# Patient Record
Sex: Male | Born: 1995 | Race: White | Hispanic: No | Marital: Single | Smoking: Never smoker
Health system: Southern US, Community
[De-identification: ages and names within clinical notes are randomized; demographics above are authoritative.]

## PROBLEM LIST (undated history)

## (undated) DIAGNOSIS — F840 Autistic disorder: Secondary | ICD-10-CM

## (undated) DIAGNOSIS — R4189 Other symptoms and signs involving cognitive functions and awareness: Secondary | ICD-10-CM

## (undated) DIAGNOSIS — Q043 Other reduction deformities of brain: Secondary | ICD-10-CM

## (undated) DIAGNOSIS — F82 Specific developmental disorder of motor function: Secondary | ICD-10-CM

## (undated) DIAGNOSIS — F419 Anxiety disorder, unspecified: Secondary | ICD-10-CM

## (undated) DIAGNOSIS — F70 Mild intellectual disabilities: Secondary | ICD-10-CM

## (undated) DIAGNOSIS — H519 Unspecified disorder of binocular movement: Secondary | ICD-10-CM

## (undated) DIAGNOSIS — Q8789 Other specified congenital malformation syndromes, not elsewhere classified: Secondary | ICD-10-CM

## (undated) DIAGNOSIS — T7840XA Allergy, unspecified, initial encounter: Secondary | ICD-10-CM

## (undated) DIAGNOSIS — J45909 Unspecified asthma, uncomplicated: Secondary | ICD-10-CM

## (undated) DIAGNOSIS — F064 Anxiety disorder due to known physiological condition: Secondary | ICD-10-CM

## (undated) HISTORY — DX: Unspecified disorder of binocular movement: H51.9

## (undated) HISTORY — DX: Anxiety disorder, unspecified: F41.9

## (undated) HISTORY — DX: Mild intellectual disabilities: F70

## (undated) HISTORY — DX: Allergy, unspecified, initial encounter: T78.40XA

## (undated) HISTORY — DX: Unspecified asthma, uncomplicated: J45.909

## (undated) HISTORY — PX: OTHER SURGICAL HISTORY: SHX169

## (undated) HISTORY — PX: MRI: SHX5353

---
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
## 2017-01-24 DIAGNOSIS — Z0000 Encounter for general adult medical examination without abnormal findings: Secondary | ICD-10-CM | POA: Diagnosis not present

## 2017-01-24 DIAGNOSIS — Z23 Encounter for immunization: Secondary | ICD-10-CM | POA: Diagnosis not present

## 2017-01-24 DIAGNOSIS — Z13220 Encounter for screening for lipoid disorders: Secondary | ICD-10-CM | POA: Diagnosis not present

## 2017-01-24 DIAGNOSIS — Q8789 Other specified congenital malformation syndromes, not elsewhere classified: Secondary | ICD-10-CM | POA: Diagnosis not present

## 2017-02-05 ENCOUNTER — Other Ambulatory Visit: Payer: Self-pay | Admitting: Family Medicine

## 2017-02-05 DIAGNOSIS — Q8789 Other specified congenital malformation syndromes, not elsewhere classified: Secondary | ICD-10-CM

## 2017-02-12 ENCOUNTER — Ambulatory Visit
Admission: RE | Admit: 2017-02-12 | Discharge: 2017-02-12 | Disposition: A | Discharge status: Home / Self Care | Payer: BLUE CROSS/BLUE SHIELD | Source: Ambulatory Visit | Attending: Family Medicine | Admitting: Family Medicine

## 2017-02-12 DIAGNOSIS — Q8789 Other specified congenital malformation syndromes, not elsewhere classified: Secondary | ICD-10-CM

## 2017-02-12 DIAGNOSIS — Q043 Other reduction deformities of brain: Secondary | ICD-10-CM | POA: Diagnosis not present

## 2017-02-19 DIAGNOSIS — F840 Autistic disorder: Secondary | ICD-10-CM | POA: Diagnosis not present

## 2017-02-22 DIAGNOSIS — H5007 Alternating esotropia with V pattern: Secondary | ICD-10-CM | POA: Diagnosis not present

## 2017-05-29 DIAGNOSIS — F840 Autistic disorder: Secondary | ICD-10-CM | POA: Diagnosis not present

## 2018-06-27 IMAGING — US US ABDOMEN COMPLETE
1 series · 14 of 25 positions shown · non-contrast
Comparison: 12/12/2011

CLINICAL DATA: Follow-up Real syndrome

EXAM:
ABDOMEN ULTRASOUND COMPLETE

[Series 1: us abdomen complete · 0.15mm/px · 14 of 70 slices shown]
[im 1/70]
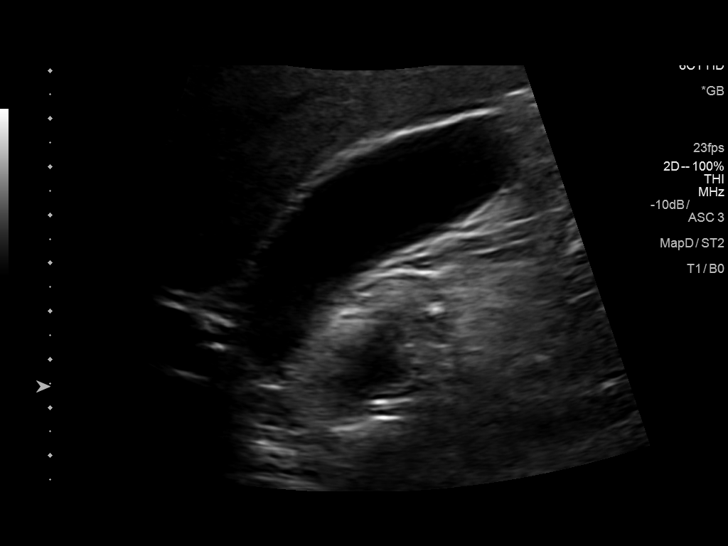
[im 6/70]
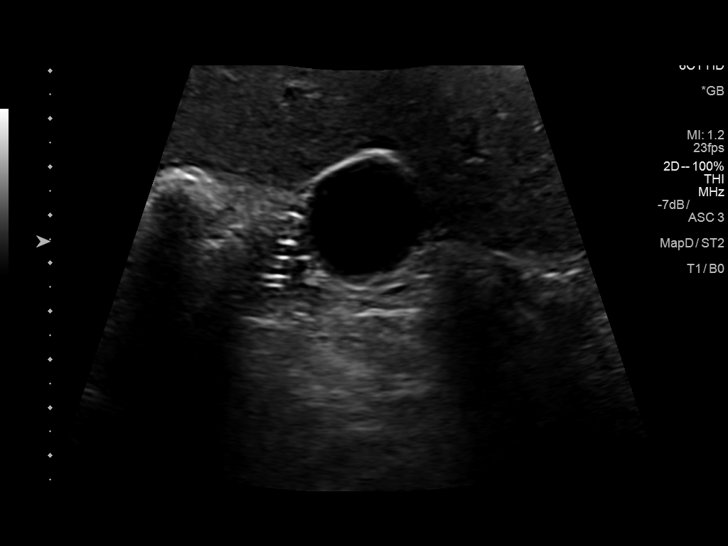
[im 12/70]
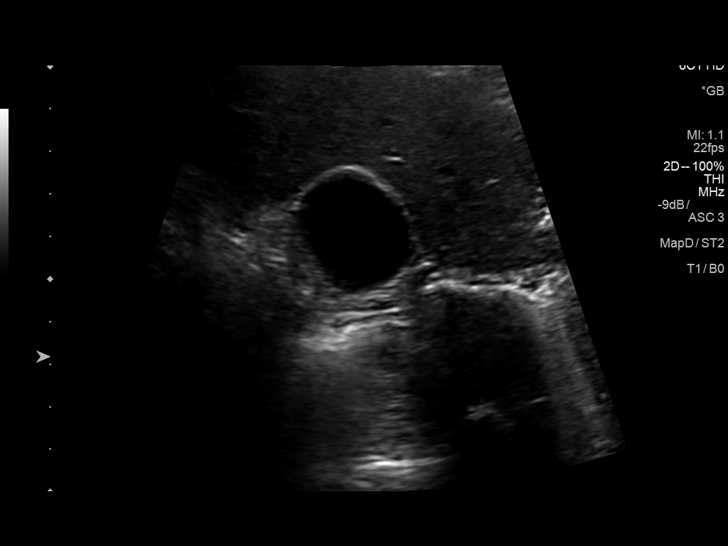
[im 18/70]
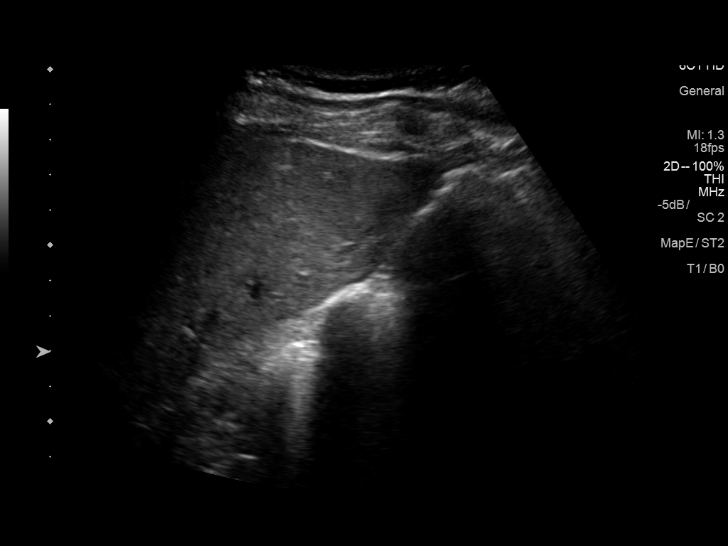
[im 24/70]
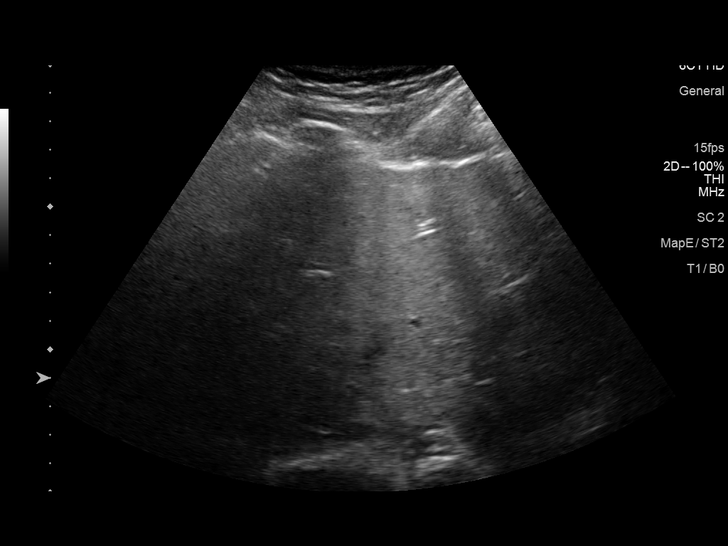
[im 26/70]
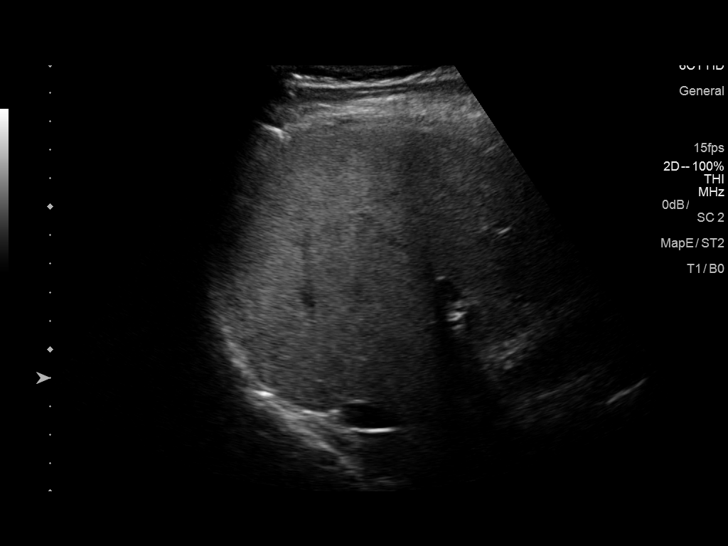
[im 32/70]
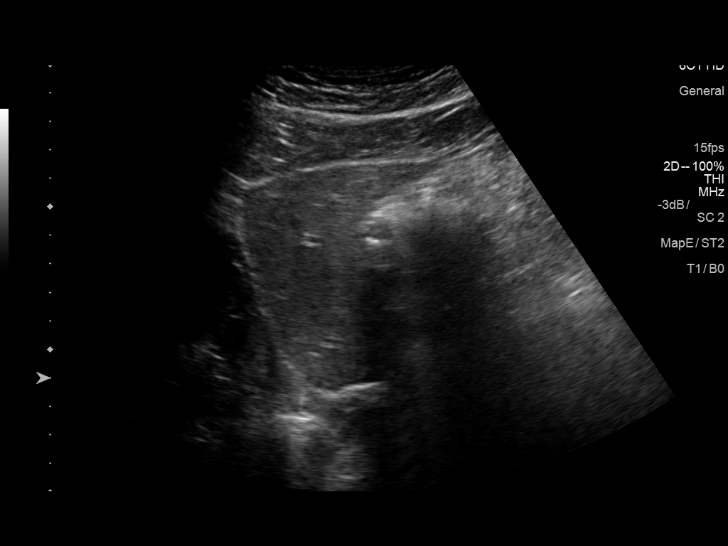
[im 38/70]
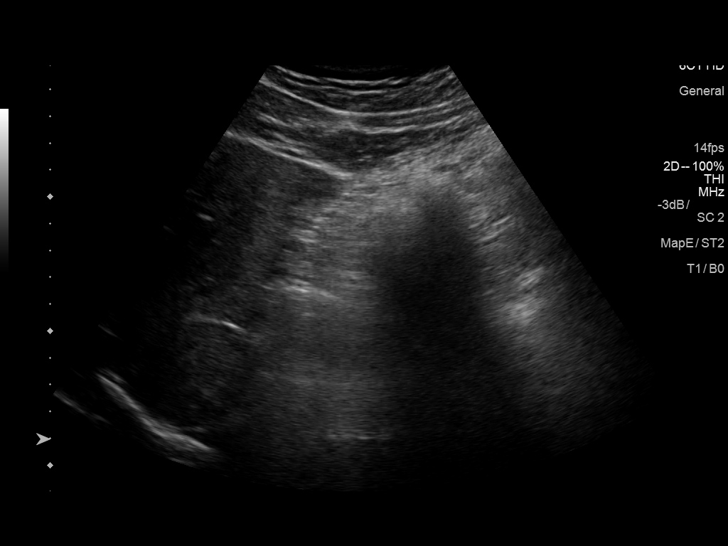
[im 44/70]
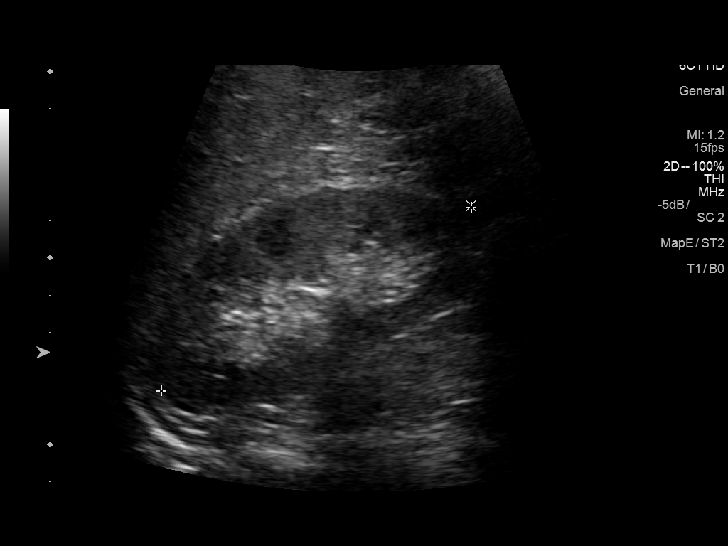
[im 47/70]
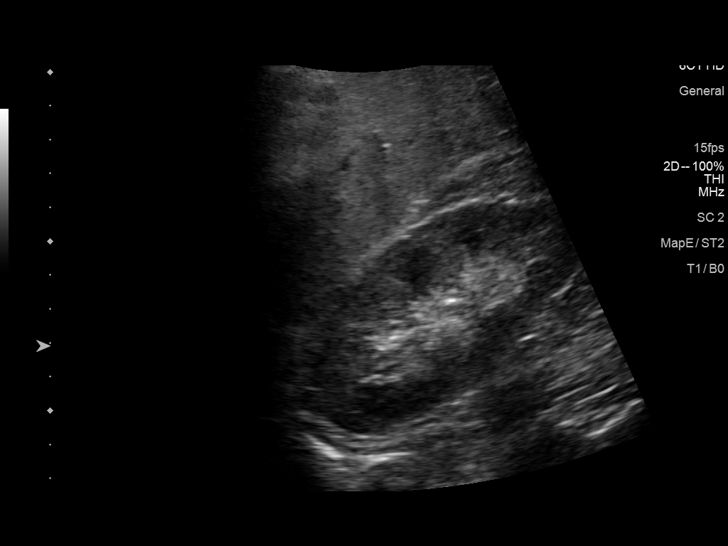
[im 52/70]
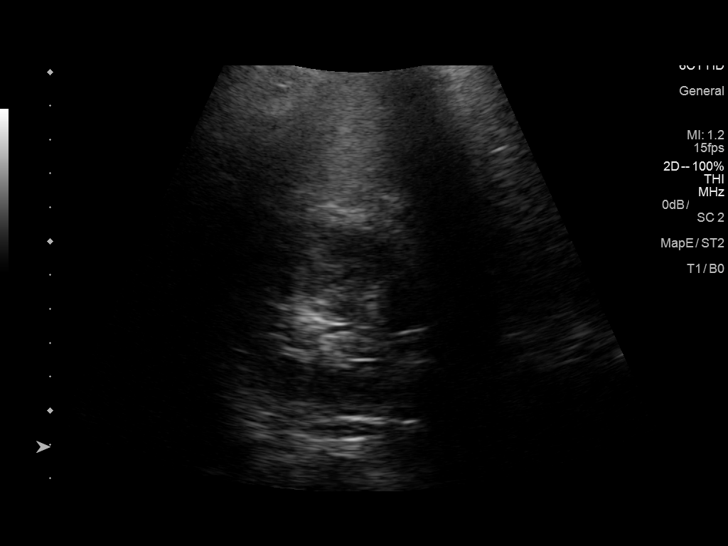
[im 58/70]
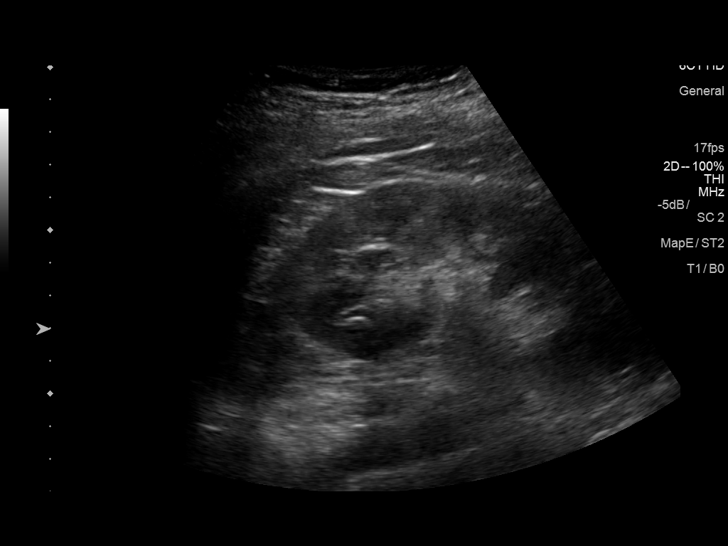
[im 64/70]
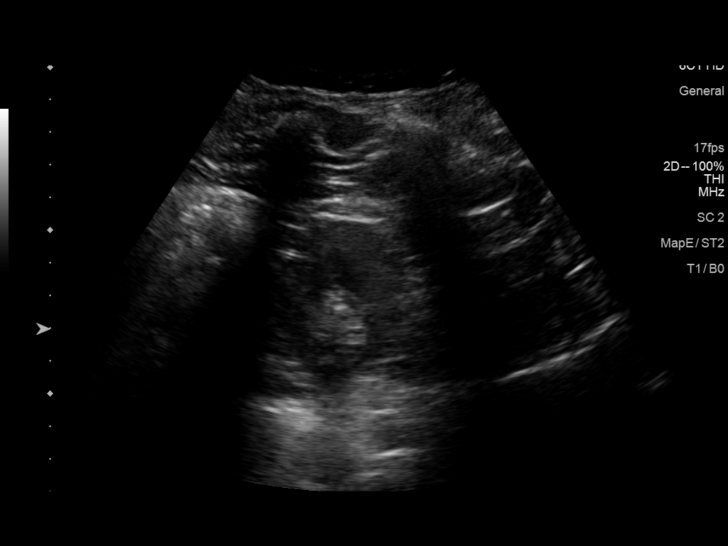
[im 70/70]
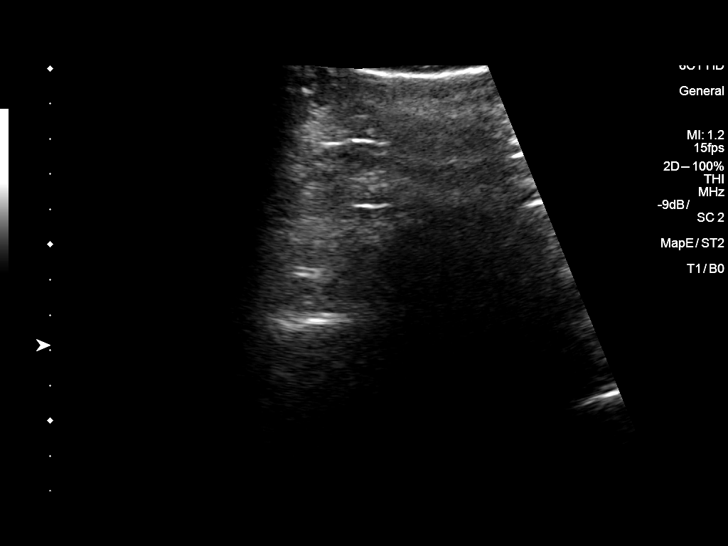

[14 of 25 positions shown; findings below may reference images not displayed]

FINDINGS: Gallbladder: No gallstones or wall thickening visualized. No
sonographic Murphy sign noted by sonographer.

Common bile duct: Diameter: Normal caliber, 2 mm

Liver: No focal lesion identified. Within normal limits in
parenchymal echogenicity. Portal vein is patent on color Doppler
imaging with normal direction of blood flow towards the liver.

IVC: No abnormality visualized.

Pancreas: Not well visualized due to overlying bowel gas

Spleen: Size and appearance within normal limits.

Right Kidney: Length: 10.3 cm. Echogenicity within normal limits. No
mass or hydronephrosis visualized.

Left Kidney: Length: 10.9 cm. Echogenicity within normal limits. No
mass or hydronephrosis visualized.

Abdominal aorta: No aneurysm visualized.

Other findings: None.
IMPRESSION: Unremarkable abdominal ultrasound. The pancreas is not well
visualized due to overlying bowel gas.

## 2018-12-18 DIAGNOSIS — Q031 Atresia of foramina of Magendie and Luschka: Secondary | ICD-10-CM | POA: Diagnosis not present

## 2018-12-18 DIAGNOSIS — H5006 Alternating esotropia with A pattern: Secondary | ICD-10-CM | POA: Diagnosis not present

## 2018-12-18 DIAGNOSIS — H5203 Hypermetropia, bilateral: Secondary | ICD-10-CM | POA: Diagnosis not present

## 2019-03-11 DIAGNOSIS — Z23 Encounter for immunization: Secondary | ICD-10-CM | POA: Diagnosis not present

## 2019-12-02 DIAGNOSIS — Z0000 Encounter for general adult medical examination without abnormal findings: Secondary | ICD-10-CM | POA: Diagnosis not present

## 2019-12-02 DIAGNOSIS — Q8789 Other specified congenital malformation syndromes, not elsewhere classified: Secondary | ICD-10-CM | POA: Diagnosis not present

## 2019-12-02 DIAGNOSIS — Z23 Encounter for immunization: Secondary | ICD-10-CM | POA: Diagnosis not present

## 2019-12-02 DIAGNOSIS — Z13220 Encounter for screening for lipoid disorders: Secondary | ICD-10-CM | POA: Diagnosis not present

## 2019-12-09 DIAGNOSIS — R350 Frequency of micturition: Secondary | ICD-10-CM | POA: Diagnosis not present

## 2019-12-23 DIAGNOSIS — F324 Major depressive disorder, single episode, in partial remission: Secondary | ICD-10-CM | POA: Diagnosis not present

## 2019-12-23 DIAGNOSIS — F419 Anxiety disorder, unspecified: Secondary | ICD-10-CM | POA: Diagnosis not present

## 2020-03-16 DIAGNOSIS — Q031 Atresia of foramina of Magendie and Luschka: Secondary | ICD-10-CM | POA: Diagnosis not present

## 2020-03-16 DIAGNOSIS — H5006 Alternating esotropia with A pattern: Secondary | ICD-10-CM | POA: Diagnosis not present

## 2020-03-16 DIAGNOSIS — H5203 Hypermetropia, bilateral: Secondary | ICD-10-CM | POA: Diagnosis not present

## 2020-07-23 DIAGNOSIS — F419 Anxiety disorder, unspecified: Secondary | ICD-10-CM | POA: Diagnosis not present

## 2020-07-23 DIAGNOSIS — F324 Major depressive disorder, single episode, in partial remission: Secondary | ICD-10-CM | POA: Diagnosis not present

## 2020-07-23 DIAGNOSIS — F428 Other obsessive-compulsive disorder: Secondary | ICD-10-CM | POA: Diagnosis not present

## 2020-07-23 DIAGNOSIS — F840 Autistic disorder: Secondary | ICD-10-CM | POA: Diagnosis not present

## 2020-11-03 DIAGNOSIS — F840 Autistic disorder: Secondary | ICD-10-CM | POA: Diagnosis not present

## 2020-11-08 DIAGNOSIS — F840 Autistic disorder: Secondary | ICD-10-CM | POA: Diagnosis not present

## 2021-01-26 ENCOUNTER — Other Ambulatory Visit: Payer: Self-pay | Admitting: Family Medicine

## 2021-01-26 DIAGNOSIS — Z0000 Encounter for general adult medical examination without abnormal findings: Secondary | ICD-10-CM | POA: Diagnosis not present

## 2021-01-26 DIAGNOSIS — Z13220 Encounter for screening for lipoid disorders: Secondary | ICD-10-CM | POA: Diagnosis not present

## 2021-01-26 DIAGNOSIS — F324 Major depressive disorder, single episode, in partial remission: Secondary | ICD-10-CM | POA: Diagnosis not present

## 2021-01-26 DIAGNOSIS — Q8789 Other specified congenital malformation syndromes, not elsewhere classified: Secondary | ICD-10-CM | POA: Diagnosis not present

## 2021-02-14 ENCOUNTER — Other Ambulatory Visit: Payer: Self-pay

## 2021-02-14 ENCOUNTER — Ambulatory Visit
Admission: RE | Admit: 2021-02-14 | Discharge: 2021-02-14 | Disposition: A | Discharge status: Home / Self Care | Payer: BC Managed Care – PPO | Source: Ambulatory Visit | Attending: Family Medicine | Admitting: Family Medicine

## 2021-02-14 DIAGNOSIS — Q8789 Other specified congenital malformation syndromes, not elsewhere classified: Secondary | ICD-10-CM

## 2021-10-25 IMAGING — US US ABDOMEN COMPLETE
1 series · 14 of 25 positions shown · non-contrast
Comparison: 02/12/2017

CLINICAL DATA: Racelle syndrome

EXAM:
ABDOMEN ULTRASOUND COMPLETE

[Series 1: us abdomen complete · 0.21mm/px · 14 of 87 slices shown]
[im 1/87]
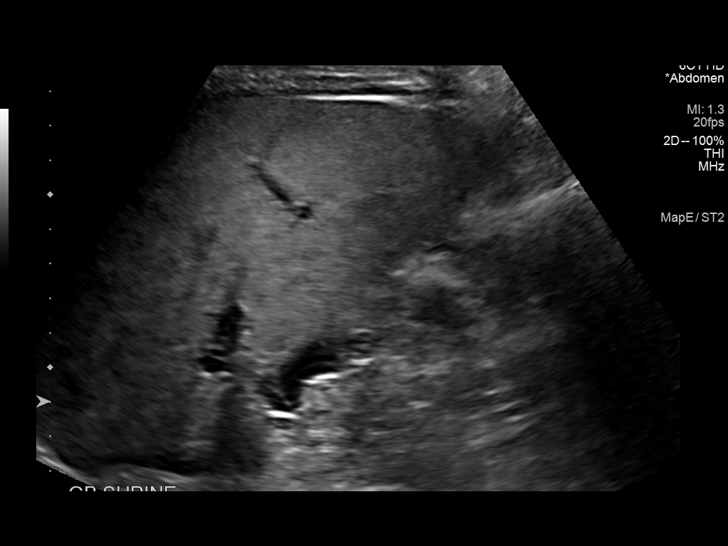
[im 8/87]
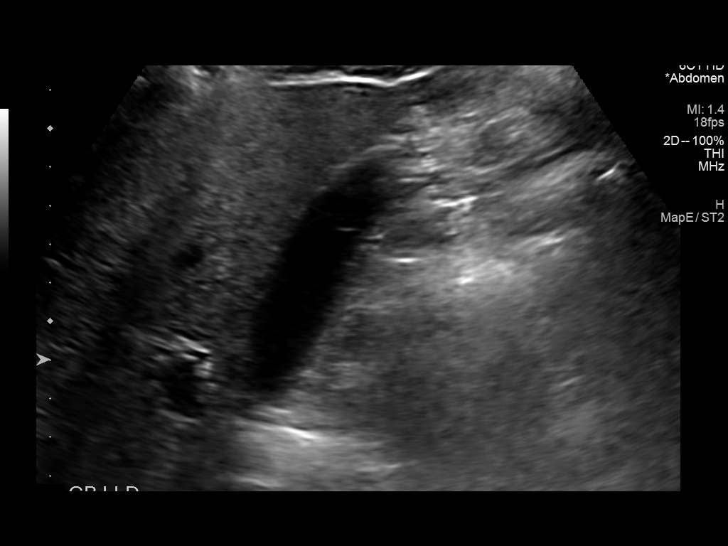
[im 15/87]
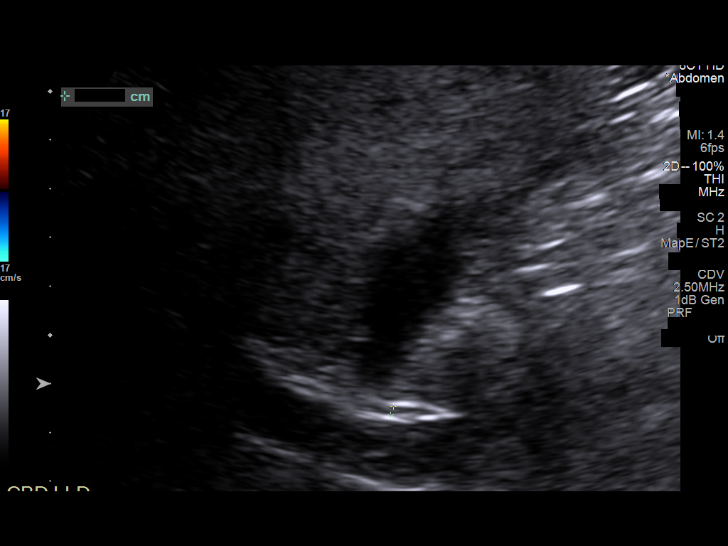
[im 22/87]
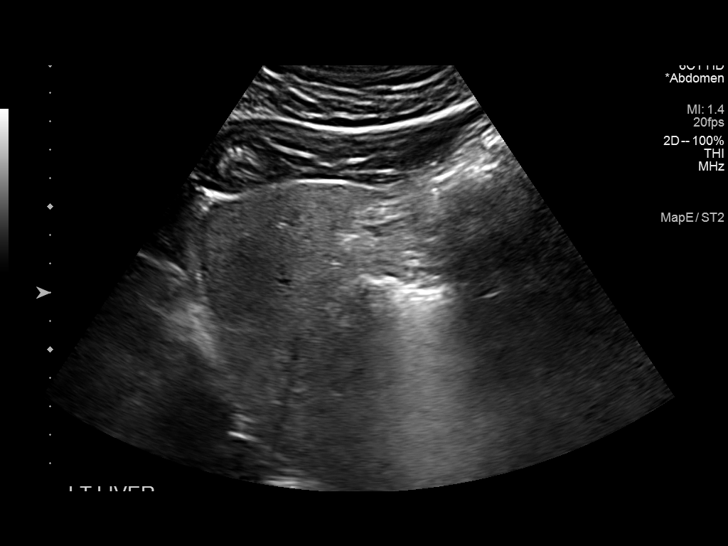
[im 29/87]
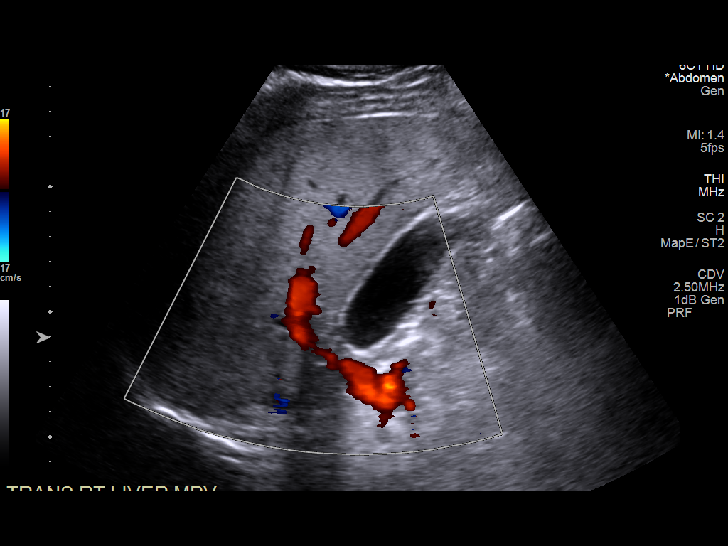
[im 33/87]
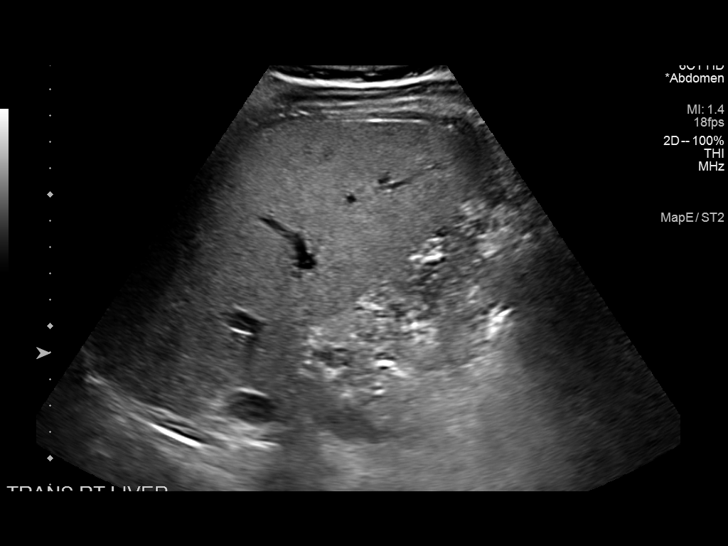
[im 40/87]
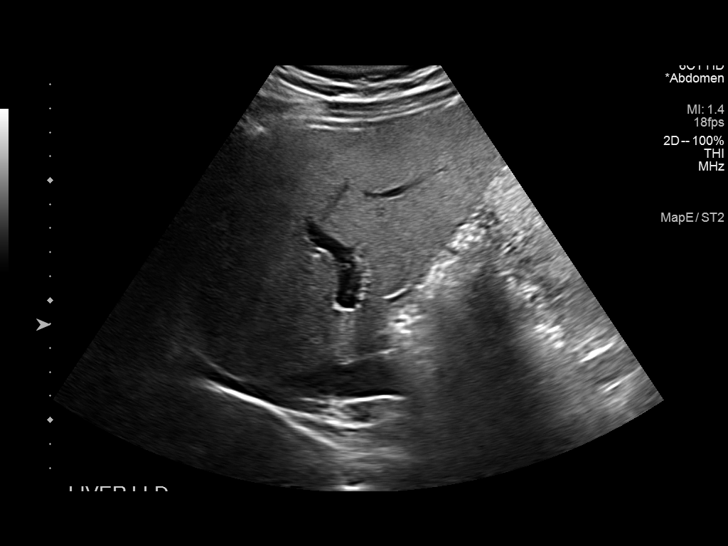
[im 47/87]
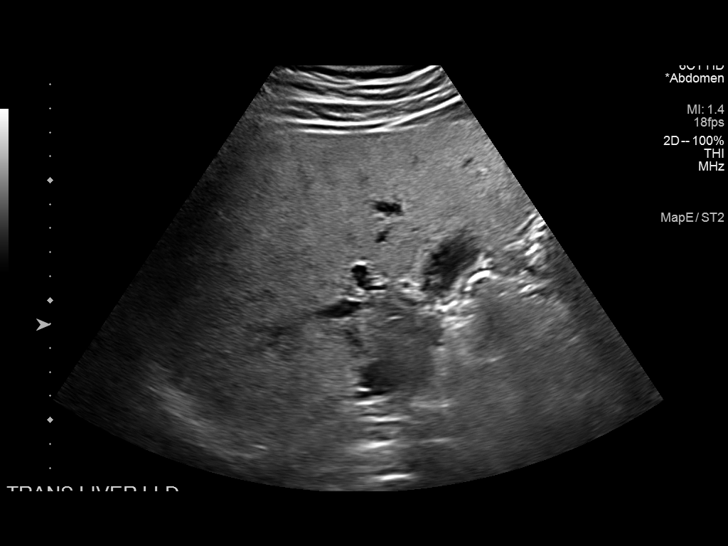
[im 54/87]
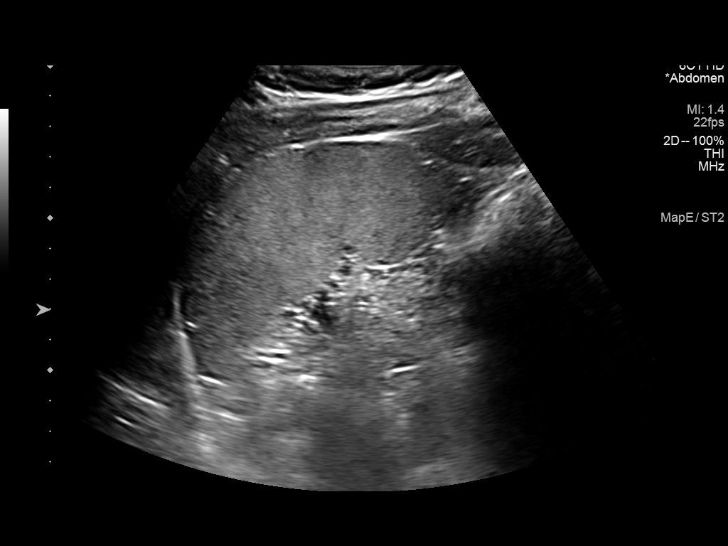
[im 58/87]
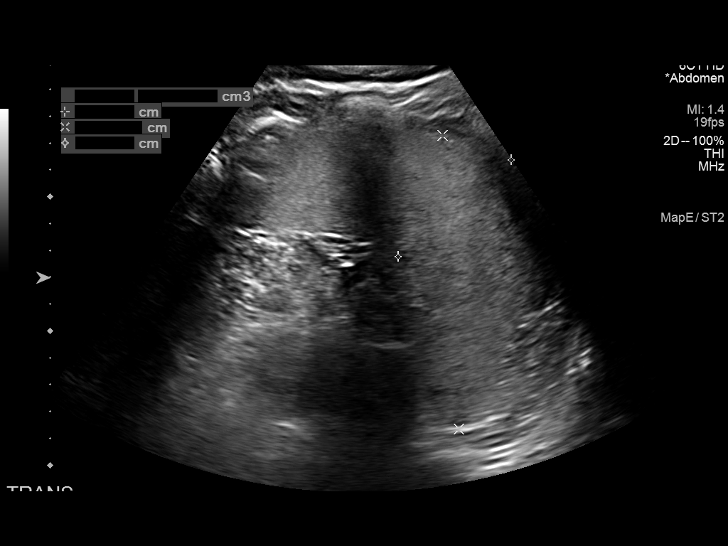
[im 65/87]
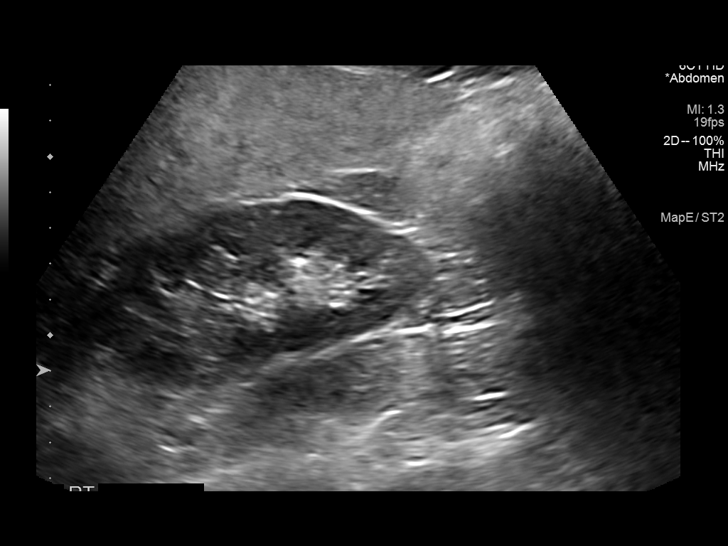
[im 72/87]
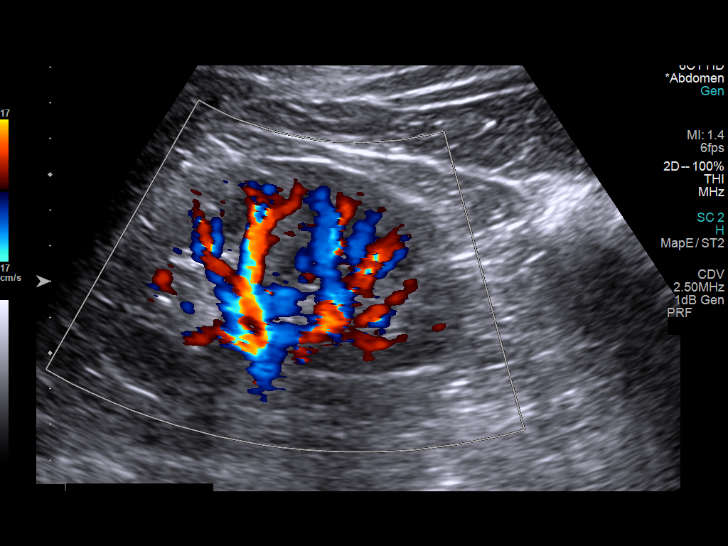
[im 79/87]
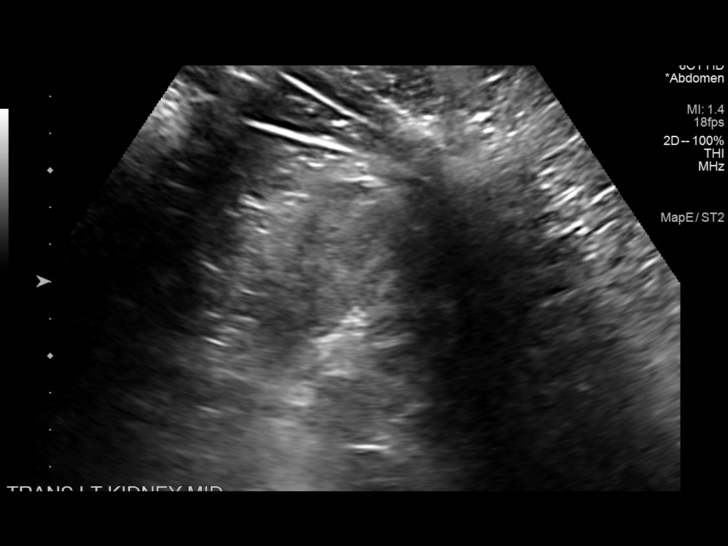
[im 87/87]
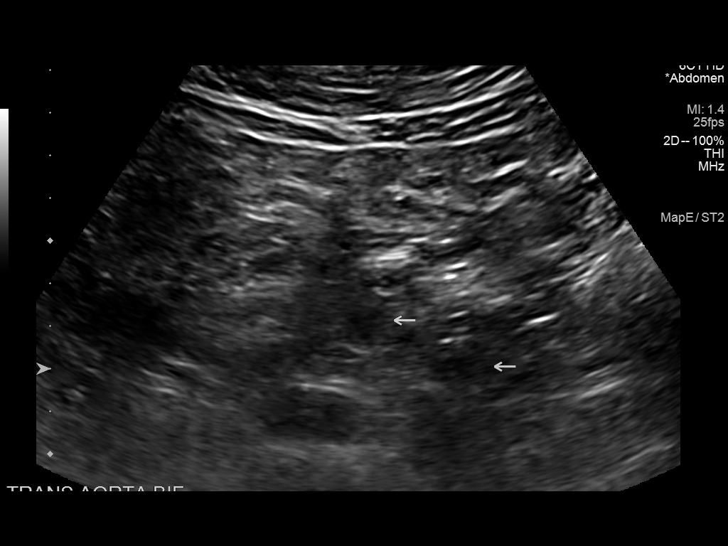

[14 of 25 positions shown; findings below may reference images not displayed]

FINDINGS: Gallbladder: No gallstones or wall thickening visualized. No
sonographic Murphy sign noted by sonographer.

Common bile duct: Diameter: 3 mm

Liver: No focal lesion identified. Within normal limits in
parenchymal echogenicity. Portal vein is patent on color Doppler
imaging with normal direction of blood flow towards the liver.

IVC: No abnormality visualized.

Pancreas: Not well visualized due to bowel gas.

Spleen: Size and appearance within normal limits.

Right Kidney: Length: 9.7 cm. Echogenicity within normal limits. No
mass or hydronephrosis visualized.

Left Kidney: Length: 10.7 cm. Echogenicity within normal limits. No
mass or hydronephrosis visualized.

Abdominal aorta: No aneurysm visualized.

Other findings: None.
IMPRESSION: 1. Suboptimal visualization of the pancreas due to bowel gas.
2. Otherwise unremarkable abdominal ultrasound.

## 2022-01-31 DIAGNOSIS — F419 Anxiety disorder, unspecified: Secondary | ICD-10-CM | POA: Diagnosis not present

## 2022-01-31 DIAGNOSIS — F324 Major depressive disorder, single episode, in partial remission: Secondary | ICD-10-CM | POA: Diagnosis not present

## 2022-01-31 DIAGNOSIS — Z0000 Encounter for general adult medical examination without abnormal findings: Secondary | ICD-10-CM | POA: Diagnosis not present

## 2022-01-31 DIAGNOSIS — Z13220 Encounter for screening for lipoid disorders: Secondary | ICD-10-CM | POA: Diagnosis not present

## 2022-01-31 DIAGNOSIS — Q8789 Other specified congenital malformation syndromes, not elsewhere classified: Secondary | ICD-10-CM | POA: Diagnosis not present

## 2022-02-13 DIAGNOSIS — H518 Other specified disorders of binocular movement: Secondary | ICD-10-CM | POA: Diagnosis not present

## 2022-02-13 DIAGNOSIS — Q898 Other specified congenital malformations: Secondary | ICD-10-CM | POA: Diagnosis not present

## 2022-02-13 DIAGNOSIS — H532 Diplopia: Secondary | ICD-10-CM | POA: Diagnosis not present

## 2022-02-13 DIAGNOSIS — H5501 Congenital nystagmus: Secondary | ICD-10-CM | POA: Diagnosis not present

## 2022-08-02 DIAGNOSIS — F324 Major depressive disorder, single episode, in partial remission: Secondary | ICD-10-CM | POA: Diagnosis not present

## 2022-08-02 DIAGNOSIS — F428 Other obsessive-compulsive disorder: Secondary | ICD-10-CM | POA: Diagnosis not present

## 2022-08-02 DIAGNOSIS — F840 Autistic disorder: Secondary | ICD-10-CM | POA: Diagnosis not present

## 2022-08-02 DIAGNOSIS — F411 Generalized anxiety disorder: Secondary | ICD-10-CM | POA: Diagnosis not present

## 2024-06-03 ENCOUNTER — Other Ambulatory Visit: Payer: Self-pay | Admitting: Nephrology

## 2024-06-03 DIAGNOSIS — R809 Proteinuria, unspecified: Secondary | ICD-10-CM

## 2024-06-16 ENCOUNTER — Inpatient Hospital Stay: Admission: RE | Admit: 2024-06-16 | Discharge: 2024-06-16 | Attending: Nephrology | Admitting: Nephrology

## 2024-06-16 DIAGNOSIS — R809 Proteinuria, unspecified: Secondary | ICD-10-CM
